# Patient Record
Sex: Female | Born: 2000 | Race: White | Hispanic: No | Marital: Single | Smoking: Never smoker
Health system: Southern US, Community
[De-identification: ages and names within clinical notes are randomized; demographics above are authoritative.]

## PROBLEM LIST (undated history)

## (undated) DIAGNOSIS — H1010 Acute atopic conjunctivitis, unspecified eye: Secondary | ICD-10-CM

## (undated) DIAGNOSIS — Z91018 Allergy to other foods: Secondary | ICD-10-CM

## (undated) DIAGNOSIS — J309 Allergic rhinitis, unspecified: Secondary | ICD-10-CM

## (undated) HISTORY — PX: OTHER SURGICAL HISTORY: SHX169

## (undated) HISTORY — PX: TYMPANOSTOMY TUBE PLACEMENT: SHX32

## (undated) HISTORY — DX: Allergy to other foods: Z91.018

## (undated) HISTORY — DX: Acute atopic conjunctivitis, unspecified eye: H10.10

## (undated) HISTORY — DX: Allergic rhinitis, unspecified: J30.9

---
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
## 2019-07-02 ENCOUNTER — Ambulatory Visit: Payer: 59 | Admitting: Family Medicine

## 2019-07-04 ENCOUNTER — Other Ambulatory Visit: Payer: Self-pay

## 2019-07-04 ENCOUNTER — Ambulatory Visit: Payer: 59 | Admitting: Family Medicine

## 2019-07-04 ENCOUNTER — Encounter: Payer: Self-pay | Admitting: Family Medicine

## 2019-07-04 VITALS — BP 102/72 | HR 60 | Temp 97.4°F | Resp 16 | Ht 65.0 in | Wt 135.0 lb

## 2019-07-04 DIAGNOSIS — T7800XD Anaphylactic reaction due to unspecified food, subsequent encounter: Secondary | ICD-10-CM | POA: Diagnosis not present

## 2019-07-04 DIAGNOSIS — Z8709 Personal history of other diseases of the respiratory system: Secondary | ICD-10-CM | POA: Diagnosis not present

## 2019-07-04 DIAGNOSIS — J3089 Other allergic rhinitis: Secondary | ICD-10-CM | POA: Diagnosis not present

## 2019-07-04 MED ORDER — EPINEPHRINE 0.3 MG/0.3ML IJ SOAJ: INTRAMUSCULAR | 1 refills | Status: DC

## 2019-07-04 NOTE — Patient Instructions (Signed)
Continue to avoid peanut, tree nuts, and coconut. In case of an allergic reaction, give Benadryl 4 teaspoonfuls every 4 hours, and if life-threatening symptoms occur, inject with AuviQ 0.3 mg.  Cetirizine 10 mg once a day as needed for a runny nose  Continue the other medications as listed in your chart  Call me if this treatment plan is not working well for you  Follow up in 1 year or sooner if needed

## 2019-07-04 NOTE — Progress Notes (Addendum)
[place] [address] Dept: [phone]  FOLLOW UP NOTE  Patient ID: Lisa Huber, female    DOB: [birth year]  Age: 18 y.o. MRN: [id number] Date of Office Visit: 07/04/2019  Assessment  Chief Complaint: Food Intolerance (food allergy to peanuts and tree nuts)  HPI Lisa Huber is an 18 year old female who presents to the clinic for a follow up visit. She was last seen in thic clinic on 07/31/2018 by Gareth Morgan, FNP for evaluation of allergic rhinitis, food allergy to peanuts and tree nuts, and history of asthma. At today's visit, she reports that she continues to avoid peanuts and tree nuts. She has not had any accidental ingestion nor has she needed her epinephrine device since her last visit to this clinic. She reports allergic rhinitis as well controlled with occasional nasal symptoms for which she takes cetirizine 10 mg as needed with full resolution of symptoms. She has a history of asthma and has not had symptoms of asthma or used any inhalers or montelukast for several years. Her current medications are listed in the chart.   Drug Allergies:  Allergies  Allergen Reactions  . Peanut-Containing Drug Products Anaphylaxis    Tree nuts    Physical Exam: BP 102/72   Pulse 60   Temp (!) 97.4 F (36.3 C) (Temporal)   Resp 16   Ht 5\' 5"  (1.651 m)   Wt 135 lb (61.2 kg)   SpO2 98%   BMI 22.47 kg/m    Physical Exam Vitals signs reviewed.  Constitutional:      Appearance: Normal appearance.  HENT:     Head: Normocephalic and atraumatic.     Right Ear: Tympanic membrane normal.     Left Ear: Tympanic membrane normal.     Nose:     Comments: Bilateral nares normal. Pharynx normal. Ears normal. Eyes normal.    Mouth/Throat:     Pharynx: Oropharynx is clear.  Eyes:     Conjunctiva/sclera: Conjunctivae normal.  Neck:     Musculoskeletal: Normal range of motion and neck supple.  Cardiovascular:     Rate and Rhythm: Normal rate and regular rhythm.   Heart sounds: Normal heart sounds. No murmur.  Pulmonary:     Effort: Pulmonary effort is normal.     Breath sounds: Normal breath sounds.     Comments: Lungs clear to auscultation Musculoskeletal: Normal range of motion.  Skin:    General: Skin is warm and dry.  Neurological:     Mental Status: She is alert and oriented to person, place, and time.  Psychiatric:        Mood and Affect: Mood normal.        Behavior: Behavior normal.        Thought Content: Thought content normal.        Judgment: Judgment normal.      Assessment and Plan: 1. Other allergic rhinitis   2. Anaphylactic shock due to food, subsequent encounter   3. History of asthma     Meds ordered this encounter  Medications  . EPINEPHrine (AUVI-Q) 0.3 mg/0.3 mL IJ SOAJ injection    Sig: Use as directed for severe allergic reaction    Dispense:  2 each    Refill:  1    [phone] (M) One pack for home one pack for school    Patient Instructions  Continue to avoid peanut, tree nuts, and coconut. In case of an allergic reaction, give Benadryl 4 teaspoonfuls every  4 hours, and if life-threatening symptoms occur, inject with AuviQ 0.3 mg.  Cetirizine 10 mg once a day as needed for a runny nose  Continue the other medications as listed in your chart  Call me if this treatment plan is not working well for you  Follow up in 1 year or sooner if needed   Return in about 1 year (around 07/03/2020), or if symptoms worsen or fail to improve.    Thank you for the opportunity to care for this patient.  Please do not hesitate to contact me with questions.  Gareth Morgan, FNP [facility]  _________________________________________________  I have provided oversight concerning [name]'s evaluation and treatment of this patient's health issues addressed during today's encounter.  I agree with the assessment and therapeutic plan as outlined in the note.   Signed,   R Edgar Frisk, MD

## 2019-12-02 ENCOUNTER — Telehealth: Payer: Self-pay

## 2019-12-02 NOTE — Telephone Encounter (Signed)
NOTES ON FILE FROM [facility] [phone], SENT REFERRAL TO SCHEDULING

## 2019-12-11 ENCOUNTER — Ambulatory Visit: Payer: 59

## 2019-12-11 ENCOUNTER — Other Ambulatory Visit: Payer: Self-pay

## 2019-12-11 ENCOUNTER — Ambulatory Visit (INDEPENDENT_AMBULATORY_CARE_PROVIDER_SITE_OTHER): Payer: 59 | Admitting: Cardiology

## 2019-12-11 ENCOUNTER — Encounter: Payer: Self-pay | Admitting: Cardiology

## 2019-12-11 VITALS — BP 112/78 | HR 67 | Ht 65.02 in | Wt 138.0 lb

## 2019-12-11 DIAGNOSIS — R002 Palpitations: Secondary | ICD-10-CM | POA: Diagnosis not present

## 2019-12-11 NOTE — Patient Instructions (Signed)
Medication Instructions:  None  *If you need a refill on your cardiac medications before your next appointment, please call your pharmacy*  Lab Work: Your physician recommends that you return for lab work in: Today  BMET. Magnesium, TSH  If you have labs (blood work) drawn today and your tests are completely normal, you will receive your results only by: Marland Kitchen MyChart Message (if you have MyChart) OR . A paper copy in the mail If you have any lab test that is abnormal or we need to change your treatment, we will call you to review the results.  Testing/Procedures: Your physician has requested that you have an echocardiogram. Echocardiography is a painless test that uses sound waves to create images of your heart. It provides your doctor with information about the size and shape of your heart and how well your heart's chambers and valves are working. This procedure takes approximately one hour. There are no restrictions for this procedure.  A zio monitor was ordered today. It will remain on for 7 days. You will then return monitor and event diary in provided box. It takes 1-2 weeks for report to be downloaded and returned to [place]. We will call you with the results. If monitor falls off or has orange flashing light, please call [employer]io for further instructions.      Follow-Up: At [facility], you and your health needs are our priority.  As part of our continuing mission to provide you with exceptional heart care, we have created designated Provider Care Teams.  These Care Teams include your primary Cardiologist (physician) and Advanced Practice Providers (APPs -  Physician Assistants and Nurse Practitioners) who all work together to provide you with the care you need, when you need it.  Your next appointment:   3 month(s)  The format for your next appointment:   In Person  Provider:   Berniece Salines, DO  Other Instructions None

## 2019-12-11 NOTE — Progress Notes (Signed)
Cardiology Office Note:    Date:  12/11/2019   ID:  Lisa Huber, DOB [birth year], MRN [id number]  PCP:  Lennie Hummer, MD  Cardiologist:  Berniece Salines, DO  Electrophysiologist:  None   Referring MD: Marcelina Morel, MD   Chief complaint: Palpitations  History of Present Illness:    Lisa Huber is a 19 y.o. female with a hx of asthma and food allergy presents today to be evaluated for palpitations.  Patient tells me that over the last several months she has been experiencing significant palpitations.  She notes that initially this used to be intermittent mostly at night where she was experiencing a abrupt fast heartbeat which lasted for few seconds and then resolve itself.  She states that she usually does not experience this during the day but only at night.  However in the last few weeks she has been experiencing this more at night with frequent episodes.  She still describes as abrupt onset of fast heartbeat which lasts for about 10 seconds prior to resolution.  And states that she would have several episodes during the nighttime.  Nuys any chest pain, shortness of breath, nausea, vomiting, dizziness.  Past Medical History:  Diagnosis Date  . Allergic rhinoconjunctivitis   . Asthma   . Food allergy     Past Surgical History:  Procedure Laterality Date  . arm surgery    . TUMOR REMOVAL  05/31/2018   parotid gland  . TYMPANOSTOMY TUBE PLACEMENT     18 months    Current Medications: Current Meds  Medication Sig  . EPINEPHrine (AUVI-Q) 0.3 mg/0.3 mL IJ SOAJ injection Use as directed for severe allergic reaction     Allergies:   Peanut-containing drug products   Social History   Socioeconomic History  . Marital status: Single    Spouse name: Not on file  . Number of children: Not on file  . Years of education: Not on file  . Highest education level: Not on file  Occupational History  . Not on file  Tobacco Use  . Smoking status: Never Smoker  . Smokeless tobacco:  Never Used  Substance and Sexual Activity  . Alcohol use: No  . Drug use: Not on file  . Sexual activity: Never    Birth control/protection: None  Other Topics Concern  . Not on file  Social History Narrative  . Not on file   Social Determinants of Health   Financial Resource Strain:   . Difficulty of Paying Living Expenses: Not on file  Food Insecurity:   . Worried About Charity fundraiser in the Last Year: Not on file  . Ran Out of Food in the Last Year: Not on file  Transportation Needs:   . Lack of Transportation (Medical): Not on file  . Lack of Transportation (Non-Medical): Not on file  Physical Activity:   . Days of Exercise per Week: Not on file  . Minutes of Exercise per Session: Not on file  Stress:   . Feeling of Stress : Not on file  Social Connections:   . Frequency of Communication with Friends and Family: Not on file  . Frequency of Social Gatherings with Friends and Family: Not on file  . Attends Religious Services: Not on file  . Active Member of Clubs or Organizations: Not on file  . Attends Archivist Meetings: Not on file  . Marital Status: Not on file     Family History: The patient's family history includes  Diabetes in her paternal grandmother; Heart disease in her maternal grandmother and paternal grandfather; Hypertension in her mother.  ROS:   Review of Systems  Constitution: Negative for decreased appetite, fever and weight gain.  HENT: Negative for congestion, ear discharge, hoarse voice and sore throat.   Eyes: Negative for discharge, redness, vision loss in right eye and visual halos.  Cardiovascular: Reports palpitations.  Negative for chest pain, dyspnea on exertion, leg swelling, orthopnea. Respiratory: Negative for cough, hemoptysis, shortness of breath and snoring.   Endocrine: Negative for heat intolerance and polyphagia.  Hematologic/Lymphatic: Negative for bleeding problem. Does not bruise/bleed easily.  Skin: Negative for  flushing, nail changes, rash and suspicious lesions.  Musculoskeletal: Negative for arthritis, joint pain, muscle cramps, myalgias, neck pain and stiffness.  Gastrointestinal: Negative for abdominal pain, bowel incontinence, diarrhea and excessive appetite.  Genitourinary: Negative for decreased libido, genital sores and incomplete emptying.  Neurological: Negative for brief paralysis, focal weakness, headaches and loss of balance.  Psychiatric/Behavioral: Negative for altered mental status, depression and suicidal ideas.  Allergic/Immunologic: Negative for HIV exposure and persistent infections.    EKGs/Labs/Other Studies Reviewed:    The following studies were reviewed today:   EKG:  The ekg ordered today demonstrates sinus rhythm, heart rate 67 bpm, no prior EKG for comparison.  Recent Labs: No results found for requested labs within last 8760 hours.  Recent Lipid Panel No results found for: CHOL, TRIG, HDL, CHOLHDL, VLDL, LDLCALC, LDLDIRECT  Physical Exam:    VS:  BP 112/78 (BP Location: Left Arm, Patient Position: Sitting, Cuff Size: Normal)   Pulse 67   Ht 5' 5.02" (1.652 m)   Wt 138 lb (62.6 kg)   SpO2 99%   BMI 22.95 kg/m     Wt Readings from Last 3 Encounters:  12/11/19 138 lb (62.6 kg) (68 %, Z= 0.46)*  07/04/19 135 lb (61.2 kg) (65 %, Z= 0.39)*  07/31/18 132 lb 12.8 oz (60.2 kg) (66 %, Z= 0.40)*   * Growth percentiles are based on CDC (Girls, 2-20 Years) data.     GEN: Well nourished, well developed in no acute distress HEENT: Normal NECK: No JVD; No carotid bruits LYMPHATICS: No lymphadenopathy CARDIAC: S1S2 noted,RRR, no murmurs, rubs, gallops RESPIRATORY:  Clear to auscultation without rales, wheezing or rhonchi  ABDOMEN: Soft, non-tender, non-distended, +bowel sounds, no guarding. EXTREMITIES: No edema, No cyanosis, no clubbing MUSCULOSKELETAL:  No edema; No deformity  SKIN: Warm and dry NEUROLOGIC:  Alert and oriented x 3, non-focal PSYCHIATRIC:   Normal affect, good insight  ASSESSMENT:    1. Palpitations    PLAN:    1.  I would like to rule out a cardiovascular etiology of this palpitation, therefore at this time I would like to placed a zio patch for  7 days. In additon a transthoracic echocardiogram will be ordered to assess LV/RV function and any structural abnormalities. Once these testing have been performed amd reviewed further reccomendations will be made. For now, I do reccomend that the patient goes to the nearest ED if  symptoms recur.  The patient is in agreement with the above plan. The patient left the office in stable condition.  The patient will follow up in 3 months or sooner if needed   Medication Adjustments/Labs and Tests Ordered: Current medicines are reviewed at length with the patient today.  Concerns regarding medicines are outlined above.  Orders Placed This Encounter  Procedures  . EKG 12-Lead  . ECHOCARDIOGRAM COMPLETE   No orders  of the defined types were placed in this encounter.   There are no Patient Instructions on file for this visit.   Adopting a Healthy Lifestyle.  Know what a healthy weight is for you (roughly BMI <25) and aim to maintain this   Aim for 7+ servings of fruits and vegetables daily   65-80+ fluid ounces of water or unsweet tea for healthy kidneys   Limit to max 1 drink of alcohol per day; avoid smoking/tobacco   Limit animal fats in diet for cholesterol and heart health - choose grass fed whenever available   Avoid highly processed foods, and foods high in saturated/trans fats   Aim for low stress - take time to unwind and care for your mental health   Aim for 150 min of moderate intensity exercise weekly for heart health, and weights twice weekly for bone health   Aim for 7-9 hours of sleep daily   When it comes to diets, agreement about the perfect plan isnt easy to find, even among the experts. Experts at the [employer] developed an idea  known as the Healthy Eating Plate. Just imagine a plate divided into logical, healthy portions.   The emphasis is on diet quality:   Load up on vegetables and fruits - one-half of your plate: Aim for color and variety, and remember that potatoes dont count.   Go for whole grains - one-quarter of your plate: Whole wheat, barley, wheat berries, quinoa, oats, brown rice, and foods made with them. If you want pasta, go with whole wheat pasta.   Protein power - one-quarter of your plate: Fish, chicken, beans, and nuts are all healthy, versatile protein sources. Limit red meat.   The diet, however, does go beyond the plate, offering a few other suggestions.   Use healthy plant oils, such as olive, canola, soy, corn, sunflower and peanut. Check the labels, and avoid partially hydrogenated oil, which have unhealthy trans fats.   If youre thirsty, drink water. Coffee and tea are good in moderation, but skip sugary drinks and limit milk and dairy products to one or two daily servings.   The type of carbohydrate in the diet is more important than the amount. Some sources of carbohydrates, such as vegetables, fruits, whole grains, and beans-are healthier than others.   Finally, stay active  Signed, Berniece Salines, DO  12/11/2019 10:29 AM    [facility]

## 2019-12-12 LAB — BASIC METABOLIC PANEL
BUN/Creatinine Ratio: 13 (ref 9–23)
BUN: 10 mg/dL (ref 6–20)
CO2: 24 mmol/L (ref 20–29)
Calcium: 9.2 mg/dL (ref 8.7–10.2)
Chloride: 104 mmol/L (ref 96–106)
Creatinine, Ser: 0.78 mg/dL (ref 0.57–1.00)
GFR calc Af Amer: 127 mL/min/{1.73_m2} (ref >59)
GFR calc non Af Amer: 111 mL/min/{1.73_m2} (ref >59)
Glucose: 84 mg/dL (ref 65–99)
Potassium: 4.5 mmol/L (ref 3.5–5.2)
Sodium: 139 mmol/L (ref 134–144)

## 2019-12-12 LAB — MAGNESIUM: Magnesium: 2.1 mg/dL (ref 1.6–2.3)

## 2019-12-12 LAB — TSH: TSH: 1.25 u[IU]/mL (ref 0.450–4.500)

## 2019-12-19 ENCOUNTER — Other Ambulatory Visit: Payer: Self-pay

## 2019-12-19 ENCOUNTER — Ambulatory Visit (HOSPITAL_BASED_OUTPATIENT_CLINIC_OR_DEPARTMENT_OTHER)
Admission: RE | Admit: 2019-12-19 | Discharge: 2019-12-19 | Disposition: A | Discharge status: Home / Self Care | Payer: 59 | Source: Ambulatory Visit | Attending: Cardiology | Admitting: Cardiology

## 2019-12-19 DIAGNOSIS — R002 Palpitations: Secondary | ICD-10-CM | POA: Insufficient documentation

## 2019-12-19 NOTE — Progress Notes (Signed)
  Echocardiogram 2D Echocardiogram has been performed.  Lisa Huber 12/19/2019, 8:46 AM

## 2020-03-06 ENCOUNTER — Ambulatory Visit: Payer: 59 | Admitting: Cardiology

## 2021-07-12 ENCOUNTER — Encounter: Payer: Self-pay | Admitting: Allergy & Immunology

## 2021-07-12 ENCOUNTER — Other Ambulatory Visit: Payer: Self-pay

## 2021-07-12 ENCOUNTER — Ambulatory Visit: Payer: 59 | Admitting: Allergy & Immunology

## 2021-07-12 VITALS — BP 118/64 | HR 73 | Temp 98.5°F | Resp 18 | Ht 65.0 in | Wt 131.8 lb

## 2021-07-12 DIAGNOSIS — T7800XD Anaphylactic reaction due to unspecified food, subsequent encounter: Secondary | ICD-10-CM | POA: Diagnosis not present

## 2021-07-12 DIAGNOSIS — J3089 Other allergic rhinitis: Secondary | ICD-10-CM | POA: Diagnosis not present

## 2021-07-12 MED ORDER — EPINEPHRINE 0.3 MG/0.3ML IJ SOAJ: INTRAMUSCULAR | 1 refills | Status: AC

## 2021-07-12 NOTE — Progress Notes (Signed)
FOLLOW UP  Date of Service/Encounter:  07/12/21   Assessment:   Allergic rhinitis  Anaphylactic shock due to food (peanuts, tree nuts) - with small sizes today and confirming with blood work  Plan/Recommendations:   1. Anaphylactic shock due to food - Testing was negative to cashew and pistachio and slightly reactive to the others (peanut was the largest). - We are going to get blood work to confirm. - I think we could at [place] do a pistachio or cashew challenge (these are HIGHLY cross reactive and both were negative on skin testing, so that is a good place to start). - We might be able to do more depending on the blood work.   2. Return in about 1 year (around 07/12/2022).    Subjective:   Lisa Huber is a 20 y.o. female presenting today for follow up of  Chief Complaint  Patient presents with   Follow-up   Allergy Testing    Pt. States would like to be retested for peanut, coconut and tree nut allergy.    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  Lisa Huber is a 20 y.o. female presenting for a follow up visit.  She was last seen in July 2020 by Webb Silversmith, one of our nurse practitioners.  At that time, she was encouraged to continue avoidance of peanuts, tree nuts, and coconut.  Her Auvi-Q was updated.  She was continued on 10 mg Zyrtec as needed for rhinorrhea.  Her last testing was performed in July 2018.  She was 4+ to all of the tree nuts.  It does not seem that peanuts were tested.  Since last visit, she has done well. She is going to do something with friends for her birthday.  She does not have firm plans at this point.  Allergic Rhinitis Symptom History: She takes a Zyrtec as needed for her environmental  allergies.  She does not use a nose spray.  She has not had any sinus infections.  Food Allergy Symptom History: She reports that she had anaphylaxis to peanut butter when she was around [age] year of age. She has had three reactions when she was a baby. She can tell that she is allergic because she does make peanut butter smoothies ([employer]). She reports that her throat tightens up and she starts sneezing a lot. This is treated with Benadryl, which does fine as long as she only takes one.  She has never tried tree nuts at all. She avoids coconut as well. She thinks that she had a reaction to coconut on a cruise ship. This is when she was first tested. Her Wynona Luna is not up to date.  She is open to trying these foods again.  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  She is also working part-time in a [employer].  Otherwise, there have been no changes to her past medical history, surgical history, family history, or social history.    Review of Systems  Constitutional: Negative.  Negative for chills, fever, malaise/fatigue and weight loss.  HENT: Negative.  Negative for congestion, ear discharge and ear pain.   Eyes:  Negative for pain, discharge and redness.  Respiratory:  Negative for cough, sputum production, shortness  of breath and wheezing.   Cardiovascular: Negative.  Negative for chest pain and palpitations.  Gastrointestinal:  Negative for abdominal pain, constipation, diarrhea, heartburn, nausea and vomiting.  Skin: Negative.  Negative for itching and rash.  Neurological:  Negative for dizziness and headaches.  Endo/Heme/Allergies:  Negative for environmental allergies. Does not bruise/bleed easily.      Objective:   Blood pressure 118/64, pulse 73, temperature 98.5 F (36.9 C), temperature source Temporal, resp. rate 18, height '5\' 5"'$  (1.651 m), weight 131 lb 12.8 oz (59.8 kg), SpO2 99 %. Body mass index is 21.93 kg/m.   Physical Exam:  Physical  Exam Constitutional:      Appearance: Normal appearance. She is well-developed.  HENT:     Head: Normocephalic and atraumatic.     Right Ear: Tympanic membrane, ear canal and external ear normal. No drainage, swelling or tenderness. Tympanic membrane is not injected, scarred, erythematous, retracted or bulging.     Left Ear: Tympanic membrane, ear canal and external ear normal. No drainage, swelling or tenderness. Tympanic membrane is not injected, scarred, erythematous, retracted or bulging.     Nose: No nasal deformity, septal deviation, mucosal edema or rhinorrhea.     Right Sinus: No maxillary sinus tenderness or frontal sinus tenderness.     Left Sinus: No maxillary sinus tenderness or frontal sinus tenderness.     Mouth/Throat:     Mouth: Mucous membranes are not pale and not dry.     Pharynx: Uvula midline.  Eyes:     General:        Right eye: No discharge.        Left eye: No discharge.     Conjunctiva/sclera: Conjunctivae normal.     Right eye: Right conjunctiva is not injected. No chemosis.    Left eye: Left conjunctiva is not injected. No chemosis.    Pupils: Pupils are equal, round, and reactive to light.  Cardiovascular:     Rate and Rhythm: Normal rate and regular rhythm.     Heart sounds: Normal heart sounds.  Pulmonary:     Effort: Pulmonary effort is normal. No tachypnea, accessory muscle usage or respiratory distress.     Breath sounds: Normal breath sounds. No wheezing, rhonchi or rales.  Chest:     Chest wall: No tenderness.  Abdominal:     Tenderness: There is no abdominal tenderness. There is no guarding or rebound.  Lymphadenopathy:     Head:     Right side of head: No submandibular, tonsillar or occipital adenopathy.     Left side of head: No submandibular, tonsillar or occipital adenopathy.     Cervical: No cervical adenopathy.  Skin:    Coloration: Skin is not pale.     Findings: No abrasion, erythema, petechiae or rash. Rash is not papular, urticarial  or vesicular.  Neurological:     Mental Status: She is alert.     Diagnostic studies:   Allergy Studies:     Food Adult Perc - 07/12/21 1100     Time Antigen Placed 1110    Allergen Manufacturer Lavella Hammock    Location Back    Number of allergen test 11     Control-buffer 50% Glycerol Negative    Control-Histamine 1 mg/ml 2+    1. Peanut --   7x12   10. Cashew Negative    11. [place] --   5x7   13. Almond --   3x4  14. Hazelnut --   5x6   15. [place] nut --   4x6   16. Coconut --   3x4   17. Pistachio Negative             Allergy testing results were read and interpreted by myself, documented by clinical staff.      Salvatore Marvel, MD  [facility]

## 2021-07-12 NOTE — Patient Instructions (Addendum)
1. Anaphylactic shock due to food - Testing was negative to cashew and pistachio and slightly reactive to the others (peanut was the largest). - We are going to get blood work to confirm. - I think we could at [place] do a pistachio or cashew challenge (these are HIGHLY cross reactive and both were negative on skin testing, so that is a good place to start). - We might be able to do more depending on the blood work.   2. Return in about 1 year (around 07/12/2022).    Please inform us of any Emergency Department visits, hospitalizations, or changes in symptoms. Call us before going to the ED for breathing or allergy symptoms since we might be able to fit you in for a sick visit. Feel free to contact us anytime with any questions, problems, or concerns.  It was a pleasure to meet you today!  Websites that have reliable patient information: 1. American Academy of Asthma, Allergy, and Immunology: www.aaaai.org 2. Food Allergy Research and Education (FARE): foodallergy.org 3. Mothers of Asthmatics: http://www.asthmacommunitynetwork.org 4. American College of Allergy, Asthma, and Immunology: www.acaai.org   COVID-19 Vaccine Information can be found at: ShippingScam.co.uk For questions related to vaccine distribution or appointments, please email [email or web address] or call [phone].   We realize that you might be concerned about having an allergic reaction to the COVID19 vaccines. To help with that concern, WE ARE OFFERING THE COVID19 VACCINES IN OUR OFFICE! Ask the front desk for dates!     "Like" [place] on Facebook and Instagram for our latest updates!      A healthy democracy works best when [employer] participate! Make sure you are registered to vote! If you have moved or changed any of your contact information, you will need to get this updated before voting!  In some cases, you MAY be able to register to vote online:  [email or web address]      1. Peanut --   7x12  10. Cashew Negative   11. [place] --   5x7  13. Almond --   3x4  14. Hazelnut --   5x6  15. [place] nut --   4x6  16. Coconut --   3x4  17. Pistachio Negative

## 2021-07-16 LAB — IGE NUT PROF. W/COMPONENT RFLX
F017-IgE Hazelnut (Filbert): 11.3 kU/L — AB
F018-IgE Brazil Nut: 3.49 kU/L — AB
F020-IgE Almond: 6.47 kU/L — AB
F202-IgE Cashew Nut: 5.78 kU/L — AB
F203-IgE Pistachio Nut: 8.21 kU/L — AB
F256-IgE Walnut: 28.9 kU/L — AB
Macadamia Nut, IgE: 4.11 kU/L — AB
Peanut, IgE: >100 kU/L — AB
Pecan Nut IgE: 13.7 kU/L — AB

## 2021-07-16 LAB — PEANUT COMPONENTS
F352-IgE Ara h 8: <0.1 kU/L
F422-IgE Ara h 1: 74.8 kU/L — AB
F423-IgE Ara h 2: 86.2 kU/L — AB
F424-IgE Ara h 3: 22.4 kU/L — AB
F427-IgE Ara h 9: <0.1 kU/L
F447-IgE Ara h 6: 61.8 kU/L — AB

## 2021-07-16 LAB — ALLERGEN COCONUT IGE: Allergen Coconut IgE: 7.42 kU/L — AB

## 2021-07-16 LAB — PANEL 604726
Cor A 1 IgE: <0.1 kU/L
Cor A 14 IgE: <0.1 kU/L
Cor A 8 IgE: <0.1 kU/L
Cor A 9 IgE: 8.45 kU/L — AB

## 2021-07-16 LAB — PANEL 604721
Jug R 1 IgE: 29.6 kU/L — AB
Jug R 3 IgE: <0.1 kU/L

## 2021-07-16 LAB — PANEL 604350: Ber E 1 IgE: <0.1 kU/L

## 2021-07-16 LAB — ALLERGEN COMPONENT COMMENTS

## 2021-07-16 LAB — PANEL 604239: ANA O 3 IgE: <0.1 kU/L

## 2023-12-06 NOTE — L&D Delivery Note (Signed)
 Delivery Note At 1:32 PM a viable female was delivered via Vaginal, Spontaneous (Presentation: Right Occiput Anterior).  APGAR: 8, 9; weight  .   Placenta status: Spontaneous, Intact.  Cord: 3 vessels with the following complications: None.   Anesthesia: Epidural Episiotomy: None Lacerations: 1st degree Suture Repair: 3.0 vicryl rapide Est. Blood Loss (mL): 90  Placenta to path for chorioamnionitis. Maternal and newborn temps improving following delivery.   Mom to postpartum.  Baby to Couplet care / Skin to Skin.  Lisa Huber 12/01/2024, 2:10 PM

## 2024-05-06 LAB — HEPATITIS C ANTIBODY: HCV Ab: NEGATIVE

## 2024-05-06 LAB — OB RESULTS CONSOLE HIV ANTIBODY (ROUTINE TESTING): HIV: NONREACTIVE

## 2024-05-06 LAB — OB RESULTS CONSOLE RPR: RPR: NONREACTIVE

## 2024-05-06 LAB — OB RESULTS CONSOLE HEPATITIS B SURFACE ANTIGEN: Hepatitis B Surface Ag: NEGATIVE

## 2024-05-20 LAB — OB RESULTS CONSOLE GC/CHLAMYDIA
Chlamydia: NEGATIVE
Neisseria Gonorrhea: NEGATIVE

## 2024-11-11 LAB — OB RESULTS CONSOLE GBS
GBS: NEGATIVE
GBS: NEGATIVE

## 2024-11-30 ENCOUNTER — Inpatient Hospital Stay (HOSPITAL_COMMUNITY): Admitting: Anesthesiology

## 2024-11-30 ENCOUNTER — Other Ambulatory Visit: Payer: Self-pay

## 2024-11-30 ENCOUNTER — Inpatient Hospital Stay (HOSPITAL_COMMUNITY)
Admission: AD | Admit: 2024-11-30 | Discharge: 2024-12-02 | DRG: 805 | Disposition: A | Discharge status: Home / Self Care | Attending: Obstetrics and Gynecology | Admitting: Obstetrics and Gynecology

## 2024-11-30 ENCOUNTER — Encounter (HOSPITAL_COMMUNITY): Payer: Self-pay | Admitting: Obstetrics and Gynecology

## 2024-11-30 DIAGNOSIS — Z8249 Family history of ischemic heart disease and other diseases of the circulatory system: Secondary | ICD-10-CM

## 2024-11-30 DIAGNOSIS — Z833 Family history of diabetes mellitus: Secondary | ICD-10-CM

## 2024-11-30 DIAGNOSIS — O4292 Full-term premature rupture of membranes, unspecified as to length of time between rupture and onset of labor: Principal | ICD-10-CM | POA: Diagnosis present

## 2024-11-30 DIAGNOSIS — Z3A38 38 weeks gestation of pregnancy: Secondary | ICD-10-CM | POA: Diagnosis not present

## 2024-11-30 DIAGNOSIS — O411230 Chorioamnionitis, third trimester, not applicable or unspecified: Secondary | ICD-10-CM | POA: Diagnosis present

## 2024-11-30 DIAGNOSIS — O4290 Premature rupture of membranes, unspecified as to length of time between rupture and onset of labor, unspecified weeks of gestation: Principal | ICD-10-CM | POA: Diagnosis present

## 2024-11-30 LAB — CBC
HCT: 35.9 % — ABNORMAL LOW (ref 36.0–46.0)
Hemoglobin: 11.9 g/dL — ABNORMAL LOW (ref 12.0–15.0)
MCH: 29.5 pg (ref 26.0–34.0)
MCHC: 33.1 g/dL (ref 30.0–36.0)
MCV: 89.1 fL (ref 80.0–100.0)
Platelets: 174 K/uL (ref 150–400)
RBC: 4.03 MIL/uL (ref 3.87–5.11)
RDW: 13.6 % (ref 11.5–15.5)
WBC: 10 K/uL (ref 4.0–10.5)
nRBC: 0 % (ref 0.0–0.2)

## 2024-11-30 LAB — HIV ANTIBODY (ROUTINE TESTING W REFLEX): HIV Screen 4th Generation wRfx: NONREACTIVE

## 2024-11-30 LAB — TYPE AND SCREEN
ABO/RH(D): A POS
Antibody Screen: NEGATIVE

## 2024-11-30 MED ORDER — ACETAMINOPHEN 325 MG PO TABS
650.0000 mg | ORAL_TABLET | ORAL | Status: DC | PRN
  Administered 2024-12-01: 650 mg via ORAL
  Filled 2024-11-30: qty 2

## 2024-11-30 MED ORDER — SOD CITRATE-CITRIC ACID 500-334 MG/5ML PO SOLN
30.0000 mL | ORAL | Status: DC | PRN
  Administered 2024-12-01: 30 mL via ORAL
  Filled 2024-11-30: qty 30

## 2024-11-30 MED ORDER — OXYCODONE-ACETAMINOPHEN 5-325 MG PO TABS: 2.0000 | ORAL_TABLET | ORAL | Status: DC | PRN

## 2024-11-30 MED ORDER — OXYTOCIN BOLUS FROM INFUSION
333.0000 mL | Freq: Once | INTRAVENOUS | Status: AC
  Administered 2024-12-01: 333 mL via INTRAVENOUS

## 2024-11-30 MED ORDER — FENTANYL CITRATE (PF) 100 MCG/2ML IJ SOLN: 50.0000 ug | INTRAMUSCULAR | Status: DC | PRN

## 2024-11-30 MED ORDER — PHENYLEPHRINE 80 MCG/ML (10ML) SYRINGE FOR IV PUSH (FOR BLOOD PRESSURE SUPPORT): 80.0000 ug | PREFILLED_SYRINGE | INTRAVENOUS | Status: DC | PRN

## 2024-11-30 MED ORDER — EPHEDRINE 5 MG/ML INJ: 10.0000 mg | INTRAVENOUS | Status: DC | PRN

## 2024-11-30 MED ORDER — ONDANSETRON HCL 4 MG/2ML IJ SOLN: 4.0000 mg | Freq: Four times a day (QID) | INTRAMUSCULAR | Status: DC | PRN

## 2024-11-30 MED ORDER — DIPHENHYDRAMINE HCL 50 MG/ML IJ SOLN: 12.5000 mg | INTRAMUSCULAR | Status: DC | PRN

## 2024-11-30 MED ORDER — OXYCODONE-ACETAMINOPHEN 5-325 MG PO TABS: 1.0000 | ORAL_TABLET | ORAL | Status: DC | PRN

## 2024-11-30 MED ORDER — LACTATED RINGERS IV SOLN: INTRAVENOUS | Status: DC

## 2024-11-30 MED ORDER — HYDROXYZINE HCL 50 MG PO TABS: 50.0000 mg | ORAL_TABLET | Freq: Four times a day (QID) | ORAL | Status: DC | PRN

## 2024-11-30 MED ORDER — LACTATED RINGERS IV SOLN: 500.0000 mL | INTRAVENOUS | Status: DC | PRN

## 2024-11-30 MED ORDER — LACTATED RINGERS IV SOLN: 500.0000 mL | Freq: Once | INTRAVENOUS | Status: DC

## 2024-11-30 MED ORDER — LIDOCAINE HCL (PF) 1 % IJ SOLN
INTRAMUSCULAR | Status: DC | PRN
  Administered 2024-11-30: 10 mL via EPIDURAL

## 2024-11-30 MED ORDER — OXYTOCIN-SODIUM CHLORIDE 30-0.9 UT/500ML-% IV SOLN
2.5000 [IU]/h | INTRAVENOUS | Status: DC
  Filled 2024-11-30: qty 500

## 2024-11-30 MED ORDER — FENTANYL-BUPIVACAINE-NACL 0.5-0.125-0.9 MG/250ML-% EP SOLN
12.0000 mL/h | EPIDURAL | Status: DC | PRN
  Administered 2024-11-30: 12 mL/h via EPIDURAL
  Filled 2024-11-30 (×2): qty 250

## 2024-11-30 MED ORDER — LIDOCAINE HCL (PF) 1 % IJ SOLN: 30.0000 mL | INTRAMUSCULAR | Status: DC | PRN

## 2024-11-30 MED ORDER — PHENYLEPHRINE 80 MCG/ML (10ML) SYRINGE FOR IV PUSH (FOR BLOOD PRESSURE SUPPORT)
80.0000 ug | PREFILLED_SYRINGE | INTRAVENOUS | Status: DC | PRN
  Filled 2024-11-30: qty 10

## 2024-11-30 NOTE — H&P (Signed)
 OB History and Physical   Lisa Huber is a 23 y.o. female G1P0 presenting for PROM at [redacted]w[redacted]d.  She reports gush of fluid at 1845.  Cervix previously 3.5 cm in office yesterday.  SROM confirmed in MAU and cervix 3.5 / 70 / -2.  She denies significant contractions.   Pregnancy course has been uncomplicated, though rubella non-immune.   Rh positive, GBS negative, panorama low risk.    OB History     Gravida  1   Para      Term      Preterm      AB      Living         SAB      IAB      Ectopic      Multiple      Live Births             Past Medical History:  Diagnosis Date   Allergic rhinoconjunctivitis    Asthma    Food allergy     Past Surgical History:  Procedure Laterality Date   arm surgery     TUMOR REMOVAL  05/31/2018   parotid gland   TYMPANOSTOMY TUBE PLACEMENT     18 months   Family History: family history includes Diabetes in her paternal grandmother; Heart disease in her maternal grandmother and paternal grandfather; Hypertension in her mother. Social History:  reports that she has never smoked. She has never used smokeless tobacco. She reports that she does not drink alcohol and does not use drugs.     Maternal Diabetes: No Genetic Screening: Normal Maternal Ultrasounds/Referrals: N/a Fetal Ultrasounds or other Referrals:  None Maternal Substance Abuse:  No Significant Maternal Medications:  None Significant Maternal Lab Results:  Group B Strep negative Other Comments:  None  Review of Systems - Patient denies fever, chills, SOB, CP, N/V/D.  History Dilation: 3.5 Effacement (%): 70 Station: -3 Exam by:: Derrek Freund, RN Blood pressure 125/78, pulse 91, temperature 98.9 F (37.2 C), temperature source Oral, resp. rate 18, height 5' 5 (1.651 m), weight 83.9 kg, SpO2 98%. Exam Physical Exam   Gen: alert, well appearing, no distress Chest: nonlabored breathing CV: no peripheral edema Abdomen: soft, gravid Ext: no evidence of  DVT  Prenatal labs: ABO, Rh: --/--/PENDING (12/27 2040) Antibody: PENDING (12/27 2040) Rubella:   RPR:    HBsAg:    HIV:    GBS: Negative/-- (12/08 0000)   FHT cat 1  Assessment/Plan: Admit to Labor and Delivery Contractions present on toco  GBS negative Epidural when desired Birth plan discussed. She declines pitocin  unless she is not making progress at subsequent checks. Discussed risk of chorioamnionitis with prolonged ROM and benefits of augmentation of labor. She is agreeable to postpartum pit. Anticipate vaginal delivery.    Lisa Huber 11/30/2024, 9:20 PM

## 2024-11-30 NOTE — Anesthesia Preprocedure Evaluation (Signed)
Anesthesia Evaluation  Patient identified by MRN, date of birth, ID band Patient awake    Reviewed: Allergy & Precautions, NPO status , Patient's Chart, lab work & pertinent test results  Airway Mallampati: II  TM Distance: >3 FB Neck ROM: Full    Dental no notable dental hx.    Pulmonary asthma    Pulmonary exam normal breath sounds clear to auscultation       Cardiovascular negative cardio ROS Normal cardiovascular exam Rhythm:Regular Rate:Normal     Neuro/Psych negative neurological ROS  negative psych ROS   GI/Hepatic negative GI ROS, Neg liver ROS,,,  Endo/Other  negative endocrine ROS    Renal/GU negative Renal ROS  negative genitourinary   Musculoskeletal negative musculoskeletal ROS (+)    Abdominal   Peds  Hematology negative hematology ROS (+)   Anesthesia Other Findings Presents in labor  Reproductive/Obstetrics (+) Pregnancy                             Anesthesia Physical Anesthesia Plan  ASA: 2  Anesthesia Plan: Epidural   Post-op Pain Management:    Induction:   PONV Risk Score and Plan: Treatment may vary due to age or medical condition  Airway Management Planned: Natural Airway  Additional Equipment:   Intra-op Plan:   Post-operative Plan:   Informed Consent: I have reviewed the patients History and Physical, chart, labs and discussed the procedure including the risks, benefits and alternatives for the proposed anesthesia with the patient or authorized representative who has indicated his/her understanding and acceptance.       Plan Discussed with: Anesthesiologist  Anesthesia Plan Comments: (Patient identified. Risks, benefits, options discussed with patient including but not limited to bleeding, infection, nerve damage, paralysis, failed block, incomplete pain control, headache, blood pressure changes, nausea, vomiting, reactions to medication,  itching, and post partum back pain. Confirmed with bedside nurse the patient's most recent platelet count. Confirmed with the patient that they are not taking any anticoagulation, have any bleeding history or any family history of bleeding disorders. Patient expressed understanding and wishes to proceed. All questions were answered. )       Anesthesia Quick Evaluation

## 2024-11-30 NOTE — Anesthesia Procedure Notes (Signed)
 Epidural Patient location during procedure: OB Start time: 11/30/2024 11:35 PM End time: 11/30/2024 11:45 PM  Staffing Anesthesiologist: Niels Marien CROME, MD Performed: anesthesiologist   Preanesthetic Checklist Completed: patient identified, IV checked, risks and benefits discussed, monitors and equipment checked, pre-op evaluation and timeout performed  Epidural Patient position: sitting Prep: DuraPrep and site prepped and draped Patient monitoring: continuous pulse ox, blood pressure, heart rate and cardiac monitor Approach: midline Location: L3-L4 Injection technique: LOR air  Needle:  Needle type: Tuohy  Needle gauge: 17 G Needle length: 9 cm Needle insertion depth: 5 cm Catheter type: closed end flexible Catheter size: 19 Gauge Catheter at skin depth: 10 cm Test dose: negative  Assessment Sensory level: T8 Events: blood not aspirated, no cerebrospinal fluid, injection not painful, no injection resistance, no paresthesia and negative IV test  Additional Notes Patient identified. Risks/Benefits/Options discussed with patient including but not limited to bleeding, infection, nerve damage, paralysis, failed block, incomplete pain control, headache, blood pressure changes, nausea, vomiting, reactions to medication both or allergic, itching and postpartum back pain. Confirmed with bedside nurse the patient's most recent platelet count. Confirmed with patient that they are not currently taking any anticoagulation, have any bleeding history or any family history of bleeding disorders. Patient expressed understanding and wished to proceed. All questions were answered. Sterile technique was used throughout the entire procedure. Please see nursing notes for vital signs. Test dose was given through epidural catheter and negative prior to continuing to dose epidural or start infusion. Warning signs of high block given to the patient including shortness of breath, tingling/numbness in  hands, complete motor block, or any concerning symptoms with instructions to call for help. Patient was given instructions on fall risk and not to get out of bed. All questions and concerns addressed with instructions to call with any issues or inadequate analgesia.  Reason for block:procedure for pain

## 2024-11-30 NOTE — MAU Note (Signed)
 Lisa Huber is a 23 y.o. at [redacted]w[redacted]d here in MAU reporting: for ROM at 1845 with clear fluid. No complications in pregnancy. Denies VB. +FM  Onset of complaint: 1845 Pain score: 0/10 There were no vitals filed for this visit.   FHT: 145  Lab orders placed from triage: fern slide and urine

## 2024-12-01 ENCOUNTER — Encounter (HOSPITAL_COMMUNITY): Payer: Self-pay | Admitting: Obstetrics and Gynecology

## 2024-12-01 LAB — SYPHILIS: RPR W/REFLEX TO RPR TITER AND TREPONEMAL ANTIBODIES, TRADITIONAL SCREENING AND DIAGNOSIS ALGORITHM: RPR Ser Ql: NONREACTIVE

## 2024-12-01 MED ORDER — PRENATAL MULTIVITAMIN CH
1.0000 | ORAL_TABLET | Freq: Every day | ORAL | Status: DC
  Filled 2024-12-01: qty 1

## 2024-12-01 MED ORDER — DIBUCAINE (PERIANAL) 1 % EX OINT: 1.0000 | TOPICAL_OINTMENT | CUTANEOUS | Status: DC | PRN

## 2024-12-01 MED ORDER — ONDANSETRON HCL 4 MG/2ML IJ SOLN: 4.0000 mg | INTRAMUSCULAR | Status: DC | PRN

## 2024-12-01 MED ORDER — ACETAMINOPHEN 10 MG/ML IV SOLN
1000.0000 mg | Freq: Once | INTRAVENOUS | Status: AC
  Administered 2024-12-01: 1000 mg via INTRAVENOUS
  Filled 2024-12-01: qty 100

## 2024-12-01 MED ORDER — ONDANSETRON HCL 4 MG PO TABS: 4.0000 mg | ORAL_TABLET | ORAL | Status: DC | PRN

## 2024-12-01 MED ORDER — SIMETHICONE 80 MG PO CHEW: 80.0000 mg | CHEWABLE_TABLET | ORAL | Status: DC | PRN

## 2024-12-01 MED ORDER — GENTAMICIN SULFATE 40 MG/ML IJ SOLN
5.0000 mg/kg | INTRAVENOUS | Status: DC
  Administered 2024-12-01: 340 mg via INTRAVENOUS
  Filled 2024-12-01: qty 8.5

## 2024-12-01 MED ORDER — OXYTOCIN-SODIUM CHLORIDE 30-0.9 UT/500ML-% IV SOLN
1.0000 m[IU]/min | INTRAVENOUS | Status: DC
  Administered 2024-12-01: 2 m[IU]/min via INTRAVENOUS

## 2024-12-01 MED ORDER — SENNOSIDES-DOCUSATE SODIUM 8.6-50 MG PO TABS
2.0000 | ORAL_TABLET | ORAL | Status: DC
  Administered 2024-12-01: 2 via ORAL
  Filled 2024-12-01: qty 2

## 2024-12-01 MED ORDER — SODIUM CHLORIDE 0.9 % IV SOLN
2.0000 g | Freq: Four times a day (QID) | INTRAVENOUS | Status: DC
  Administered 2024-12-01 (×2): 2 g via INTRAVENOUS
  Filled 2024-12-01 (×2): qty 2000

## 2024-12-01 MED ORDER — DIPHENHYDRAMINE HCL 25 MG PO CAPS: 25.0000 mg | ORAL_CAPSULE | Freq: Four times a day (QID) | ORAL | Status: DC | PRN

## 2024-12-01 MED ORDER — ZOLPIDEM TARTRATE 5 MG PO TABS: 5.0000 mg | ORAL_TABLET | Freq: Every evening | ORAL | Status: DC | PRN

## 2024-12-01 MED ORDER — COCONUT OIL OIL: 1.0000 | TOPICAL_OIL | Status: DC | PRN

## 2024-12-01 MED ORDER — IBUPROFEN 600 MG PO TABS
600.0000 mg | ORAL_TABLET | Freq: Four times a day (QID) | ORAL | Status: DC
  Administered 2024-12-02 (×3): 600 mg via ORAL
  Filled 2024-12-01 (×3): qty 1

## 2024-12-01 MED ORDER — ACETAMINOPHEN 325 MG PO TABS: 650.0000 mg | ORAL_TABLET | ORAL | Status: DC | PRN

## 2024-12-01 MED ORDER — BENZOCAINE-MENTHOL 20-0.5 % EX AERO
1.0000 | INHALATION_SPRAY | CUTANEOUS | Status: DC | PRN
  Filled 2024-12-01: qty 56

## 2024-12-01 MED ORDER — TERBUTALINE SULFATE 1 MG/ML IJ SOLN: 0.2500 mg | Freq: Once | INTRAMUSCULAR | Status: DC | PRN

## 2024-12-01 MED ORDER — PRENATAL MULTIVITAMIN CH
1.0000 | ORAL_TABLET | Freq: Every day | ORAL | Status: DC
  Administered 2024-12-02: 1 via ORAL

## 2024-12-01 MED ORDER — WITCH HAZEL-GLYCERIN EX PADS: 1.0000 | MEDICATED_PAD | CUTANEOUS | Status: DC | PRN

## 2024-12-01 MED ORDER — TETANUS-DIPHTH-ACELL PERTUSSIS 5-2-15.5 LF-MCG/0.5 IM SUSP: 0.5000 mL | Freq: Once | INTRAMUSCULAR | Status: DC

## 2024-12-01 NOTE — Progress Notes (Signed)
 Labor Progress Note  Patient's labor curve has been with steady progress.  She is currently complete, +1, with significant caput. Pushing began about 1 hr ago, recently new onset elevated temp of 102.8 F noted, as well as increased fetal and maternal heart rate.   FHT otherwise with accelerations and moderate variability.   Tylenol  was given and ampicillin  + gentamicin  ordered.   Discussed chorioamnionitis and goal for continued progress toward delivery.

## 2024-12-02 LAB — CBC WITH DIFFERENTIAL/PLATELET
Abs Immature Granulocytes: 0.15 K/uL — ABNORMAL HIGH (ref 0.00–0.07)
Basophils Absolute: 0 K/uL (ref 0.0–0.1)
Basophils Relative: 0 %
Eosinophils Absolute: 0.2 K/uL (ref 0.0–0.5)
Eosinophils Relative: 1 %
HCT: 29.2 % — ABNORMAL LOW (ref 36.0–46.0)
Hemoglobin: 9.9 g/dL — ABNORMAL LOW (ref 12.0–15.0)
Immature Granulocytes: 1 %
Lymphocytes Relative: 10 %
Lymphs Abs: 2 K/uL (ref 0.7–4.0)
MCH: 30 pg (ref 26.0–34.0)
MCHC: 33.9 g/dL (ref 30.0–36.0)
MCV: 88.5 fL (ref 80.0–100.0)
Monocytes Absolute: 1.1 K/uL — ABNORMAL HIGH (ref 0.1–1.0)
Monocytes Relative: 6 %
Neutro Abs: 16.3 K/uL — ABNORMAL HIGH (ref 1.7–7.7)
Neutrophils Relative %: 82 %
Platelets: 145 K/uL — ABNORMAL LOW (ref 150–400)
RBC: 3.3 MIL/uL — ABNORMAL LOW (ref 3.87–5.11)
RDW: 14 % (ref 11.5–15.5)
WBC: 19.8 K/uL — ABNORMAL HIGH (ref 4.0–10.5)
nRBC: 0 % (ref 0.0–0.2)

## 2024-12-02 MED ORDER — IBUPROFEN 600 MG PO TABS: 600.0000 mg | ORAL_TABLET | Freq: Four times a day (QID) | ORAL | 0 refills | Status: AC

## 2024-12-02 MED ORDER — ACETAMINOPHEN 325 MG PO TABS: 650.0000 mg | ORAL_TABLET | ORAL | 1 refills | Status: AC | PRN

## 2024-12-02 MED ORDER — MEASLES, MUMPS & RUBELLA VAC ~~LOC~~ SUSR: 0.5000 mL | Freq: Once | SUBCUTANEOUS | Status: DC

## 2024-12-02 MED ORDER — SENNOSIDES-DOCUSATE SODIUM 8.6-50 MG PO TABS: 2.0000 | ORAL_TABLET | ORAL | 1 refills | Status: AC

## 2024-12-02 NOTE — Discharge Summary (Addendum)
 "    Postpartum Discharge Summary  Date of Service updated 12/02/2024     Patient Name: Lisa Huber DOB: [birth year] MRN: [id number]  Date of admission: 11/30/2024 Delivery date:12/01/2024 Delivering provider: LEQUITA LYE A Date of discharge: 12/02/2024  Admitting diagnosis: Premature rupture of membranes [O42.90] Intrauterine pregnancy: [redacted]w[redacted]d     Secondary diagnosis:  Principal Problem:   Premature rupture of membranes  Additional problems: Chorioamnionitis    Discharge diagnosis: Term Pregnancy Delivered                                              Post partum procedures:none Augmentation: N/A Complications: Intrauterine Inflammation or infection (Chorioamniotis)  Hospital course: Onset of Labor With Vaginal Delivery      23 y.o. yo G1P1001 at [redacted]w[redacted]d was admitted in Latent Labor on 11/30/2024. Labor course was complicated by none  Membrane Rupture Time/Date: 6:45 PM,11/30/2024  Delivery Method:Vaginal, Spontaneous Operative Delivery:N/A Episiotomy: None Lacerations:  1st degree Patient had a postpartum course complicated by none.  She is ambulating, tolerating a regular diet, passing flatus, and urinating well. Patient is discharged home in stable condition on 12/02/2024.  Newborn Data: Birth date:12/01/2024 Birth time:1:32 PM Gender:Female Living status:Living Apgars:8 ,9  Weight:3450 g  Magnesium Sulfate received: No BMZ received: No Rhophylac:Yes MMR:Yes T-DaP:Given prenatally Transfusion:No Immunizations administered: There is no immunization history for the selected administration types on file for this patient.  Physical exam  Vitals:   12/01/24 1950 12/02/24 0015 12/02/24 0514 12/02/24 0844  BP: 119/73 122/67 120/69 119/74  Pulse: 100 89 75 70  Resp: 18 18 18 17   Temp: 97.6 F (36.4 C) 98.3 F (36.8 C) 98 F (36.7 C) 97.8 F (36.6 C)  TempSrc: Oral Oral Oral Oral  SpO2: 98% 98% 98% 99%  Weight:      Height:       General: alert,  cooperative, and no distress Lochia: appropriate Uterine Fundus: firm Incision: N/A DVT Evaluation: No evidence of DVT seen on physical exam. Labs: Lab Results  Component Value Date   WBC 19.8 (H) 12/02/2024   HGB 9.9 (L) 12/02/2024   HCT 29.2 (L) 12/02/2024   MCV 88.5 12/02/2024   PLT 145 (L) 12/02/2024      Latest Ref Rng & Units 12/11/2019   10:47 AM  CMP  Glucose 65 - 99 mg/dL 84   BUN 6 - 20 mg/dL 10   Creatinine 9.42 - 1.00 mg/dL 9.21   Sodium 865 - 855 mmol/L 139   Potassium 3.5 - 5.2 mmol/L 4.5   Chloride 96 - 106 mmol/L 104   CO2 20 - 29 mmol/L 24   Calcium 8.7 - 10.2 mg/dL 9.2    Edinburgh Score:    12/02/2024   12:15 AM  Edinburgh Postnatal Depression Scale Screening Tool  I have been able to laugh and see the funny side of things. 0  I have looked forward with enjoyment to things. 0  I have blamed myself unnecessarily when things went wrong. 0  I have been anxious or worried for no good reason. 0  I have felt scared or panicky for no good reason. 0  Things have been getting on top of me. 0  I have been so unhappy that I have had difficulty sleeping. 0  I have felt sad or miserable. 0  I have been so unhappy that I have  been crying. 0  The thought of harming myself has occurred to me. 0  Edinburgh Postnatal Depression Scale Total 0      After visit meds:  Allergies as of 12/02/2024       Reactions   Cocos Nucifera Anaphylaxis   Peanut-containing Drug Products Anaphylaxis   Tree nuts        Medication List     TAKE these medications    acetaminophen  325 MG tablet Commonly known as: Tylenol  Take 2 tablets (650 mg total) by mouth every 4 (four) hours as needed (for pain scale < 4).   EPINEPHrine  0.3 mg/0.3 mL Soaj injection Commonly known as: Auvi-Q  Use as directed for severe allergic reaction   ibuprofen  600 MG tablet Commonly known as: ADVIL  Take 1 tablet (600 mg total) by mouth every 6 (six) hours.   multivitamin-prenatal 27-0.8 MG  Tabs tablet Take 1 tablet by mouth daily at 12 noon.   senna-docusate 8.6-50 MG tablet Commonly known as: Senokot-S Take 2 tablets by mouth daily.        Discharge home in stable condition Infant Feeding: Bottle and Breast Infant Disposition:home with mother - currently boarding for observation due to chorio Discharge instruction: per After Visit Summary and Postpartum booklet. Activity: Advance as tolerated. Pelvic rest for 6 weeks.  Diet: routine diet Anticipated Birth Control: Unsure Postpartum Appointment:6 weeks  12/02/2024 Slater JINNY Door, MD   "

## 2024-12-02 NOTE — Lactation Note (Signed)
 This note was copied from a baby's chart. Lactation Consultation Note  Patient Name: Lisa Huber Date: 12/02/2024 Age:23  Reason for consult: Initial assessment;Early term 37-38.6wks;Primapara;1st time breastfeeding  P1, [redacted]w[redacted]d, 2% weight loss  Initial LC visit to see mother and baby Lisa Livvy. Returned after baby had her bath. Mother states her feeding plan is to latch baby while in the hospital and while producing colostrum then she wants to wean and only formula feed.   Mother states she has been latching or trying to latch baby before every formula feeding. She can latch her to the her right breast but struggles to latch her on the left. Mother can express colostrum from her right breast.  Mother would like latch assistance. Baby was just formula fed 20 ml and now sleeping post bath. Mother to call when baby is showing cues to feed.   Discussed milk production and lactogenesis II and advised that mature milk occurs around day 3-5 PP and her milk volume will increase with breast fullness. Discussed gradual weaning and non-nursing engorgement management to avoid engorgement, clogged ducts or mastitis. Mother verbalized understanding.   Mom made aware of O/P services, breastfeeding support groups,community resources, and phone # for post-discharge questions ( including weaning, pump and bottle).   Maternal Data Has patient been taught Hand Expression?: No Does the patient have breastfeeding experience prior to this delivery?: No  Feeding Mother's Current Feeding Choice: Breast Milk and Formula   Interventions Interventions: Breast feeding basics reviewed;Education;LC Services brochure;CDC milk storage guidelines;CDC Guidelines for Breast Pump Cleaning  Discharge Pump: DEBP;Personal (Spectra )  Consult Status Consult Status: Follow-up Date: 12/03/24 Follow-up type: In-patient    Joshua Line M 12/02/2024, 1:33 PM

## 2024-12-02 NOTE — Lactation Note (Signed)
 This note was copied from a baby's chart. Lactation Consultation Note  Patient Name: Lisa Huber Unijb'd Date: 12/02/2024 Age:24 hours   P1. Attempted to see mom. She was awake holding baby standing in rm in the dark. Mom stated that the baby just latched for the first time a little bit ago. Mom stated it was a little bit painful not to bad. Asked mom to call for [facility] for the next feeding to work w/mom on latching. Mom stated OK. Maternal Data    Feeding Nipple Type: Regular  LATCH Score                    Lactation Tools Discussed/Used    Interventions    Discharge    Consult Status      [name] G 12/02/2024, 3:02 AM

## 2024-12-02 NOTE — Lactation Note (Signed)
 This note was copied from a baby's chart. Lactation Consultation Note  Patient Name: Lisa Huber Date: 12/02/2024 Age:24 hours   P1. Checking in w/mom to see why she hasn't call for [facility] assist w/feeding. Mom sleeping.    Maternal Data    Feeding    LATCH Score                    Lactation Tools Discussed/Used    Interventions    Discharge    Consult Status      Lisa Huber G 12/02/2024, 6:27 AM

## 2024-12-03 LAB — SURGICAL PATHOLOGY

## 2024-12-03 NOTE — Anesthesia Postprocedure Evaluation (Signed)
"   Anesthesia Post Note  Patient: Lisa Huber  Procedure(s) Performed: AN AD HOC LABOR EPIDURAL     Patient location during evaluation: Other (post op rounds on 12/30 patient has been discharged called home phone no answer) Anesthesia Type: Epidural Vital Signs Assessment: post-procedure vital signs reviewed and stable Anesthetic complications: no   No notable events documented.  Last Vitals:  Vitals:   12/02/24 0514 12/02/24 0844  BP: 120/69 119/74  Pulse: 75 70  Resp: 18 17  Temp: 36.7 C 36.6 C  SpO2: 98% 99%    Last Pain:  Vitals:   12/02/24 0851  TempSrc:   PainSc: 0-No pain   Pain Goal:                   Lisa Huber      "

## 2024-12-09 ENCOUNTER — Inpatient Hospital Stay (HOSPITAL_COMMUNITY): Admission: RE | Admit: 2024-12-09 | Source: Home / Self Care | Admitting: Obstetrics & Gynecology

## 2024-12-10 ENCOUNTER — Telehealth (HOSPITAL_COMMUNITY): Payer: Self-pay | Admitting: *Deleted

## 2024-12-10 NOTE — Telephone Encounter (Signed)
 12/10/2024  Name: Lisa Huber MRN: [id number] DOB: 2001/10/02  Reason for Call:  Transition of Care Hospital Discharge Call  Contact Status: Patient Contact Status: Complete  Language assistant needed: Interpreter Mode: Interpreter Not Needed        Follow-Up Questions: Do You Have Any Concerns About Your Health As You Heal From Delivery?: No Do You Have Any Concerns About Your Infants Health?: No  Edinburgh Postnatal Depression Scale:  In the Past 7 Days:    PHQ2-9 Depression Scale:     Discharge Follow-up: Edinburgh score requires follow up?:  (Patient says answers are the same as in the hospital when score was 0. Patient endorses she is doing well emotionally.) Patient was advised of the following resources:: Support Group, Breastfeeding Support Group  Post-discharge interventions: Reviewed Newborn Safe Sleep Practices  Mliss Sieve, RN 12/10/2024 10:27
